# Patient Record
Sex: Female | Born: 1975 | Race: White | Hispanic: No | Marital: Married | Smoking: Never smoker
Health system: Southern US, Community
[De-identification: ages and names within clinical notes are randomized; demographics above are authoritative.]

## PROBLEM LIST (undated history)

## (undated) DIAGNOSIS — Z8619 Personal history of other infectious and parasitic diseases: Secondary | ICD-10-CM

## (undated) DIAGNOSIS — Z9889 Other specified postprocedural states: Secondary | ICD-10-CM

## (undated) HISTORY — PX: WISDOM TOOTH EXTRACTION: SHX21

## (undated) HISTORY — DX: Other specified postprocedural states: Z98.890

## (undated) HISTORY — PX: MYRINGOTOMY: SUR874

## (undated) HISTORY — PX: OTHER SURGICAL HISTORY: SHX169

## (undated) HISTORY — DX: Personal history of other infectious and parasitic diseases: Z86.19

---

## 1999-10-12 ENCOUNTER — Encounter: Payer: Self-pay | Admitting: Specialist

## 1999-10-12 ENCOUNTER — Encounter: Admission: RE | Admit: 1999-10-12 | Discharge: 1999-10-12 | Payer: Self-pay | Admitting: Specialist

## 2000-07-22 ENCOUNTER — Other Ambulatory Visit: Admission: RE | Admit: 2000-07-22 | Discharge: 2000-07-22 | Payer: Self-pay | Admitting: Gynecology

## 2001-07-24 ENCOUNTER — Other Ambulatory Visit: Admission: RE | Admit: 2001-07-24 | Discharge: 2001-07-24 | Payer: Self-pay | Admitting: Gynecology

## 2002-06-09 ENCOUNTER — Other Ambulatory Visit: Admission: RE | Admit: 2002-06-09 | Discharge: 2002-06-09 | Payer: Self-pay | Admitting: Gynecology

## 2003-06-14 ENCOUNTER — Other Ambulatory Visit: Admission: RE | Admit: 2003-06-14 | Discharge: 2003-06-14 | Payer: Self-pay | Admitting: Gynecology

## 2004-06-12 ENCOUNTER — Other Ambulatory Visit: Admission: RE | Admit: 2004-06-12 | Discharge: 2004-06-12 | Payer: Self-pay | Admitting: Gynecology

## 2005-07-12 ENCOUNTER — Other Ambulatory Visit: Admission: RE | Admit: 2005-07-12 | Discharge: 2005-07-12 | Payer: Self-pay | Admitting: Gynecology

## 2007-03-06 ENCOUNTER — Inpatient Hospital Stay (HOSPITAL_COMMUNITY): Admission: AD | Admit: 2007-03-06 | Discharge: 2007-03-09 | Payer: Self-pay | Admitting: Obstetrics and Gynecology

## 2009-10-10 ENCOUNTER — Inpatient Hospital Stay (HOSPITAL_COMMUNITY): Admission: AD | Admit: 2009-10-10 | Discharge: 2009-10-10 | Payer: Self-pay | Admitting: Obstetrics and Gynecology

## 2009-10-17 ENCOUNTER — Inpatient Hospital Stay (HOSPITAL_COMMUNITY)
Admission: RE | Admit: 2009-10-17 | Discharge: 2009-10-19 | Payer: Self-pay | Source: Home / Self Care | Admitting: Obstetrics and Gynecology

## 2010-06-10 LAB — CBC
HCT: 35.1 % — ABNORMAL LOW (ref 36.0–46.0)
Hemoglobin: 12.3 g/dL (ref 12.0–15.0)
MCH: 34.3 pg — ABNORMAL HIGH (ref 26.0–34.0)
MCHC: 35 g/dL (ref 30.0–36.0)
MCV: 97.9 fL (ref 78.0–100.0)
Platelets: 177 10*3/uL (ref 150–400)
RBC: 3.59 MIL/uL — ABNORMAL LOW (ref 3.87–5.11)
RDW: 13.1 % (ref 11.5–15.5)
WBC: 6 10*3/uL (ref 4.0–10.5)

## 2010-06-10 LAB — RPR: RPR Ser Ql: NONREACTIVE

## 2010-08-08 NOTE — Discharge Summary (Signed)
Bailey Figueroa, Bailey Figueroa               ACCOUNT NO.:  [id number]   MEDICAL RECORD NO.:  [id number]          PATIENT TYPE:  INP   LOCATION:  9137                          FACILITY:  [facility]   PHYSICIAN:  Malachi Pro. Ambrose Mantle, M.D. DATE OF BIRTH:  04/23/1975   DATE OF ADMISSION:  03/06/2007  DATE OF DISCHARGE:  03/09/2007                               DISCHARGE SUMMARY   HISTORY:  A 35 year old white female para 0, gravida 1, [redacted] weeks  gestation by 9 week ultrasound presented for elective induction with a  favorable cervix.  The blood group and type was B positive with negative  antibody, RPR nonreactive, rubella immune, hepatitis B surface antigen  negative, HIV negative, GC and chlamydia negative, 1 hour Glucola 109.  Group B strep negative.  Prenatal course was essentially uncomplicated.   PAST MEDICAL HISTORY:  Was negative.   PAST SURGICAL HISTORY:  Wisdom teeth removed and right myringotomy.   ALLERGY:  CODEINE caused emesis.   MEDICATIONS:  No medications.   PHYSICAL EXAMINATION:  On admission her vital signs were normal.  The  heart and lungs were normal.  The abdomen was soft, not tender.  Estimated fetal weight about 7 pounds.  The cervix was 3 cm, 50%, vertex  at a -2, adequate pelvis and Dr. Jackelyn Knife ruptured her membranes with  clear fluid.   ADMISSION IMPRESSION:  Intrauterine pregnancy of 40 weeks.  Pitocin was  begun.  At 5:35 a.m. the patient was comfortable with an epidural.  Fetal heart tones were reassuring.  Vaginal exam showed the vertex to be  at a +3 station, cervix completely dilated, completely effaced.  At 6:25  a.m. Dr. Jackelyn Knife discussed assisted delivery and risks.  The patient  pushed for just less than 3 hours.  She was unable to push the vertex  past a +3.  She agreed to assisted delivery.  The epidural was adequate.  The bladder was drained.  Local block was added to the perineum.  Soft  touch vacuum was applied and with three contractions the vertex was  brought to +4.  The vacuum popped off.  The vacuum was replaced after  the patient was unable to push the vertex out.  There were 2 more  popoffs.  A second degree midline episiotomy was cut to facilitate  prolonged second stage.  The patient was then able to push the vertex  out.  It was an 8 pound 13 ounce female infant with Apgars of 9 at one  and 9 at five minutes.  The placenta was spontaneous and intact.  Cord  blood collection was done.  Third degree extension repaired with 2-0  Vicryl.  Second degree episiotomy repaired with 3-0 Vicryl.  Rectum was  intact.  Blood loss about 500 mL.  Postpartum the patient did very well  and was discharged on the second postpartum day.  Initial hemoglobin  12.3, hematocrit 34.3, white count 6700, platelet count 206,000.  Followup hemoglobin 9.7, RPR was nonreactive.   FINAL DIAGNOSES:  Intrauterine pregnancy at 40 weeks delivered vertex,  prolonged second stage of labor.   OPERATION:  Vacuum assisted vaginal delivery, midline episiotomy with  third degree extension.   FINAL CONDITION:  Improved.   INSTRUCTIONS:  Include our regular discharge instruction booklet.  The  patient is also given prescription for Percocet 5/325 30 tablets one  every 4-6 hours as needed for pain and Motrin 600 mg 30 tablets one  every 6 hours as needed for pain with one refill and she is advised to  take Senokot, Metamucil, Citracal or Colace as a stool softener.  She is  to return to the office in 6 weeks for followup examination.      Malachi Pro. Ambrose Mantle, M.D.  Electronically Signed     TFH/MEDQ  D:  03/09/2007  T:  03/10/2007  Job:  [id number]

## 2011-01-01 LAB — CBC
HCT: 27.4 — ABNORMAL LOW
HCT: 34.3 — ABNORMAL LOW
Hemoglobin: 12.3
Hemoglobin: 9.7 — ABNORMAL LOW
MCHC: 35.3
MCHC: 35.8
MCV: 96.7
MCV: 97.9
Platelets: 149 — ABNORMAL LOW
Platelets: 206
RBC: 2.8 — ABNORMAL LOW
RBC: 3.55 — ABNORMAL LOW
RDW: 12.3
RDW: 12.8
WBC: 6.7
WBC: 7.6

## 2011-01-01 LAB — RPR: RPR Ser Ql: NONREACTIVE

## 2011-01-05 LAB — OB RESULTS CONSOLE ABO/RH: RH Type: POSITIVE

## 2011-01-05 LAB — OB RESULTS CONSOLE HIV ANTIBODY (ROUTINE TESTING): HIV: NONREACTIVE

## 2011-01-05 LAB — OB RESULTS CONSOLE HEPATITIS B SURFACE ANTIGEN: Hepatitis B Surface Ag: NEGATIVE

## 2011-01-05 LAB — OB RESULTS CONSOLE RPR: RPR: NONREACTIVE

## 2011-03-27 NOTE — L&D Delivery Note (Signed)
Delivery Note At 4:30 PM a viable female was delivered via Vaginal, Spontaneous Delivery (Presentation: Left Occiput Anterior).  APGAR: 9, 9; weight pending.   Placenta status: Intact, Spontaneous.  Cord: 3 vessels with the following complications: None.  Anesthesia: Epidural  Episiotomy:  None Lacerations: 2nd degree Suture Repair: 3.0 chromic Est. Blood Loss (mL): 400  Mom to postpartum.  Baby to stay with mom.  Atanacio Melnyk D 07/27/2011, 4:51 PM

## 2011-06-25 ENCOUNTER — Inpatient Hospital Stay (HOSPITAL_COMMUNITY): Admission: AD | Admit: 2011-06-25 | Payer: Self-pay | Source: Ambulatory Visit | Admitting: Obstetrics and Gynecology

## 2011-07-02 ENCOUNTER — Ambulatory Visit: Payer: 59 | Admitting: Internal Medicine

## 2011-07-02 LAB — OB RESULTS CONSOLE GBS: GBS: POSITIVE

## 2011-07-24 ENCOUNTER — Telehealth (HOSPITAL_COMMUNITY): Payer: Self-pay | Admitting: *Deleted

## 2011-07-24 ENCOUNTER — Encounter (HOSPITAL_COMMUNITY): Payer: Self-pay | Admitting: *Deleted

## 2011-07-24 NOTE — Telephone Encounter (Signed)
Preadmission screen  

## 2011-07-27 ENCOUNTER — Encounter (HOSPITAL_COMMUNITY): Payer: Self-pay | Admitting: Anesthesiology

## 2011-07-27 ENCOUNTER — Encounter (HOSPITAL_COMMUNITY): Payer: Self-pay

## 2011-07-27 ENCOUNTER — Inpatient Hospital Stay (HOSPITAL_COMMUNITY)
Admission: RE | Admit: 2011-07-27 | Discharge: 2011-07-29 | DRG: 775 | Disposition: A | Discharge status: Home / Self Care | Payer: 59 | Source: Ambulatory Visit | Attending: Obstetrics and Gynecology | Admitting: Obstetrics and Gynecology

## 2011-07-27 ENCOUNTER — Inpatient Hospital Stay (HOSPITAL_COMMUNITY): Payer: 59 | Admitting: Anesthesiology

## 2011-07-27 VITALS — BP 86/55 | HR 56 | Temp 97.9°F | Resp 18 | Ht 66.0 in | Wt 147.0 lb

## 2011-07-27 DIAGNOSIS — O09529 Supervision of elderly multigravida, unspecified trimester: Secondary | ICD-10-CM | POA: Diagnosis present

## 2011-07-27 DIAGNOSIS — Z22330 Carrier of Group B streptococcus: Secondary | ICD-10-CM

## 2011-07-27 DIAGNOSIS — Z3490 Encounter for supervision of normal pregnancy, unspecified, unspecified trimester: Secondary | ICD-10-CM | POA: Diagnosis present

## 2011-07-27 DIAGNOSIS — O99892 Other specified diseases and conditions complicating childbirth: Secondary | ICD-10-CM | POA: Diagnosis present

## 2011-07-27 LAB — CBC
Hemoglobin: 11.4 g/dL — ABNORMAL LOW (ref 12.0–15.0)
MCH: 32.1 pg (ref 26.0–34.0)
MCHC: 34.5 g/dL (ref 30.0–36.0)
Platelets: 193 10*3/uL (ref 150–400)

## 2011-07-27 LAB — RPR: RPR Ser Ql: NONREACTIVE

## 2011-07-27 MED ORDER — OXYTOCIN 20 UNITS IN LACTATED RINGERS INFUSION - SIMPLE: 125.0000 mL/h | Freq: Once | INTRAVENOUS | Status: DC

## 2011-07-27 MED ORDER — FENTANYL 2.5 MCG/ML BUPIVACAINE 1/10 % EPIDURAL INFUSION (WH - ANES)
INTRAMUSCULAR | Status: DC | PRN
  Administered 2011-07-27: 14 mL/h via EPIDURAL

## 2011-07-27 MED ORDER — ONDANSETRON HCL 4 MG/2ML IJ SOLN: 4.0000 mg | Freq: Four times a day (QID) | INTRAMUSCULAR | Status: DC | PRN

## 2011-07-27 MED ORDER — OXYTOCIN 20 UNITS IN LACTATED RINGERS INFUSION - SIMPLE: 125.0000 mL/h | INTRAVENOUS | Status: DC | PRN

## 2011-07-27 MED ORDER — OXYCODONE-ACETAMINOPHEN 5-325 MG PO TABS
1.0000 | ORAL_TABLET | ORAL | Status: DC | PRN
  Administered 2011-07-27: 1 via ORAL
  Filled 2011-07-27: qty 1

## 2011-07-27 MED ORDER — SODIUM BICARBONATE 8.4 % IV SOLN
INTRAVENOUS | Status: DC | PRN
  Administered 2011-07-27: 4 mL via EPIDURAL

## 2011-07-27 MED ORDER — CITRIC ACID-SODIUM CITRATE 334-500 MG/5ML PO SOLN: 30.0000 mL | ORAL | Status: DC | PRN

## 2011-07-27 MED ORDER — LACTATED RINGERS IV SOLN
INTRAVENOUS | Status: DC
  Administered 2011-07-27: 950 mL via INTRAVENOUS

## 2011-07-27 MED ORDER — DIPHENHYDRAMINE HCL 50 MG/ML IJ SOLN: 12.5000 mg | INTRAMUSCULAR | Status: DC | PRN

## 2011-07-27 MED ORDER — MAGNESIUM HYDROXIDE 400 MG/5ML PO SUSP: 30.0000 mL | ORAL | Status: DC | PRN

## 2011-07-27 MED ORDER — EPHEDRINE 5 MG/ML INJ: 10.0000 mg | INTRAVENOUS | Status: DC | PRN

## 2011-07-27 MED ORDER — BENZOCAINE-MENTHOL 20-0.5 % EX AERO
1.0000 "application " | INHALATION_SPRAY | CUTANEOUS | Status: DC | PRN
  Administered 2011-07-28 – 2011-07-29 (×2): 1 via TOPICAL
  Filled 2011-07-27 (×3): qty 56

## 2011-07-27 MED ORDER — ACETAMINOPHEN 325 MG PO TABS: 650.0000 mg | ORAL_TABLET | ORAL | Status: DC | PRN

## 2011-07-27 MED ORDER — PHENYLEPHRINE 40 MCG/ML (10ML) SYRINGE FOR IV PUSH (FOR BLOOD PRESSURE SUPPORT)
80.0000 ug | PREFILLED_SYRINGE | INTRAVENOUS | Status: DC | PRN
  Filled 2011-07-27: qty 5

## 2011-07-27 MED ORDER — EPHEDRINE 5 MG/ML INJ
10.0000 mg | INTRAVENOUS | Status: DC | PRN
  Filled 2011-07-27: qty 4

## 2011-07-27 MED ORDER — PHENYLEPHRINE 40 MCG/ML (10ML) SYRINGE FOR IV PUSH (FOR BLOOD PRESSURE SUPPORT): 80.0000 ug | PREFILLED_SYRINGE | INTRAVENOUS | Status: DC | PRN

## 2011-07-27 MED ORDER — DIPHENHYDRAMINE HCL 25 MG PO CAPS: 25.0000 mg | ORAL_CAPSULE | Freq: Four times a day (QID) | ORAL | Status: DC | PRN

## 2011-07-27 MED ORDER — ZOLPIDEM TARTRATE 5 MG PO TABS: 5.0000 mg | ORAL_TABLET | Freq: Every evening | ORAL | Status: DC | PRN

## 2011-07-27 MED ORDER — OXYCODONE-ACETAMINOPHEN 5-325 MG PO TABS: 1.0000 | ORAL_TABLET | ORAL | Status: DC | PRN

## 2011-07-27 MED ORDER — TETANUS-DIPHTH-ACELL PERTUSSIS 5-2.5-18.5 LF-MCG/0.5 IM SUSP
0.5000 mL | Freq: Once | INTRAMUSCULAR | Status: AC
  Administered 2011-07-29: 0.5 mL via INTRAMUSCULAR
  Filled 2011-07-27: qty 0.5

## 2011-07-27 MED ORDER — ONDANSETRON HCL 4 MG PO TABS: 4.0000 mg | ORAL_TABLET | ORAL | Status: DC | PRN

## 2011-07-27 MED ORDER — ONDANSETRON HCL 4 MG/2ML IJ SOLN: 4.0000 mg | INTRAMUSCULAR | Status: DC | PRN

## 2011-07-27 MED ORDER — LACTATED RINGERS IV SOLN: 500.0000 mL | Freq: Once | INTRAVENOUS | Status: DC

## 2011-07-27 MED ORDER — PENICILLIN G POTASSIUM 5000000 UNITS IJ SOLR
2.5000 10*6.[IU] | INTRAVENOUS | Status: DC
  Administered 2011-07-27: 2.5 10*6.[IU] via INTRAVENOUS
  Filled 2011-07-27 (×5): qty 2.5

## 2011-07-27 MED ORDER — OXYTOCIN BOLUS FROM INFUSION
500.0000 mL | Freq: Once | INTRAVENOUS | Status: DC
  Filled 2011-07-27: qty 500

## 2011-07-27 MED ORDER — PENICILLIN G POTASSIUM 5000000 UNITS IJ SOLR
5.0000 10*6.[IU] | Freq: Once | INTRAVENOUS | Status: AC
  Administered 2011-07-27: 5 10*6.[IU] via INTRAVENOUS
  Filled 2011-07-27: qty 5

## 2011-07-27 MED ORDER — FENTANYL 2.5 MCG/ML BUPIVACAINE 1/10 % EPIDURAL INFUSION (WH - ANES)
14.0000 mL/h | INTRAMUSCULAR | Status: DC
  Administered 2011-07-27: 14 mL/h via EPIDURAL
  Filled 2011-07-27 (×2): qty 60

## 2011-07-27 MED ORDER — LANOLIN HYDROUS EX OINT: TOPICAL_OINTMENT | CUTANEOUS | Status: DC | PRN

## 2011-07-27 MED ORDER — MEASLES, MUMPS & RUBELLA VAC ~~LOC~~ INJ: 0.5000 mL | INJECTION | Freq: Once | SUBCUTANEOUS | Status: DC

## 2011-07-27 MED ORDER — IBUPROFEN 600 MG PO TABS: 600.0000 mg | ORAL_TABLET | Freq: Four times a day (QID) | ORAL | Status: DC | PRN

## 2011-07-27 MED ORDER — DIBUCAINE 1 % RE OINT: 1.0000 "application " | TOPICAL_OINTMENT | RECTAL | Status: DC | PRN

## 2011-07-27 MED ORDER — WITCH HAZEL-GLYCERIN EX PADS: 1.0000 "application " | MEDICATED_PAD | CUTANEOUS | Status: DC | PRN

## 2011-07-27 MED ORDER — OXYTOCIN 20 UNITS IN LACTATED RINGERS INFUSION - SIMPLE
1.0000 m[IU]/min | INTRAVENOUS | Status: DC
  Administered 2011-07-27: 10 m[IU]/min via INTRAVENOUS
  Administered 2011-07-27: 2 m[IU]/min via INTRAVENOUS
  Administered 2011-07-27: 12 m[IU]/min via INTRAVENOUS
  Administered 2011-07-27: 8 m[IU]/min via INTRAVENOUS
  Filled 2011-07-27: qty 1000

## 2011-07-27 MED ORDER — METHYLERGONOVINE MALEATE 0.2 MG/ML IJ SOLN: 0.2000 mg | INTRAMUSCULAR | Status: DC | PRN

## 2011-07-27 MED ORDER — TERBUTALINE SULFATE 1 MG/ML IJ SOLN: 0.2500 mg | Freq: Once | INTRAMUSCULAR | Status: DC | PRN

## 2011-07-27 MED ORDER — LACTATED RINGERS IV SOLN: 500.0000 mL | INTRAVENOUS | Status: DC | PRN

## 2011-07-27 MED ORDER — METHYLERGONOVINE MALEATE 0.2 MG PO TABS: 0.2000 mg | ORAL_TABLET | ORAL | Status: DC | PRN

## 2011-07-27 MED ORDER — SENNOSIDES-DOCUSATE SODIUM 8.6-50 MG PO TABS
2.0000 | ORAL_TABLET | Freq: Every day | ORAL | Status: DC
  Administered 2011-07-27: 2 via ORAL

## 2011-07-27 MED ORDER — SIMETHICONE 80 MG PO CHEW: 80.0000 mg | CHEWABLE_TABLET | ORAL | Status: DC | PRN

## 2011-07-27 MED ORDER — PRENATAL MULTIVITAMIN CH
1.0000 | ORAL_TABLET | Freq: Every day | ORAL | Status: DC
  Administered 2011-07-28 – 2011-07-29 (×2): 1 via ORAL
  Filled 2011-07-27 (×2): qty 1

## 2011-07-27 MED ORDER — IBUPROFEN 600 MG PO TABS
600.0000 mg | ORAL_TABLET | Freq: Four times a day (QID) | ORAL | Status: DC
  Administered 2011-07-28 – 2011-07-29 (×6): 600 mg via ORAL
  Filled 2011-07-27 (×6): qty 1

## 2011-07-27 MED ORDER — LIDOCAINE HCL (PF) 1 % IJ SOLN
30.0000 mL | INTRAMUSCULAR | Status: DC | PRN
  Filled 2011-07-27: qty 30

## 2011-07-27 NOTE — H&P (Signed)
Bailey Figueroa is a 36 y.o. female presenting for elective induction.  Prenatal care complicated by palpitations with normal cardiology eval.  See prenatal records for complete history.   Maternal Medical History:  Fetal activity: Perceived fetal activity is normal.    Prenatal complications: no prenatal complications   OB History    Grav Para Term Preterm Abortions TAB SAB Ect Mult Living   3 2 2       2     SVD at term x2 without complications  Past Medical History  Diagnosis Date  . History of [facility] spotted fever   . H/O myringotomy    Past Surgical History  Procedure Date  . Myringotomy   . Wisdom tooth extraction    Family History: family history includes Arthritis in her paternal grandmother; Heart attack in her father, maternal grandfather, maternal grandmother, paternal grandfather, and paternal grandmother; Stroke in her mother and paternal grandmother; and Vision loss in her paternal grandmother. Social History:  reports that she has never smoked. She has never used smokeless tobacco. She reports that she does not drink alcohol or use illicit drugs.  Review of Systems  Respiratory: Negative.   Cardiovascular: Negative.     Dilation: 1 Effacement (%): 30 Station: -2 Exam by:: D herr rn Blood pressure 110/75, pulse 59, temperature 98.5 F (36.9 C), temperature source Oral, resp. rate 18, height 5\' 6"  (1.676 m), weight 66.679 kg (147 lb), last menstrual period 10/25/2010. Maternal Exam:  Uterine Assessment: Contraction strength is moderate.  Contraction frequency is irregular.   Abdomen: Patient reports no abdominal tenderness. Estimated fetal weight is 7 lbs.   Fetal presentation: vertex  Introitus: Normal vulva. Normal vagina.  Pelvis: adequate for delivery.   Cervix: Cervix evaluated by digital exam.     Fetal Exam Fetal Monitor Review: Mode: ultrasound.   Baseline rate: 130.  Variability: moderate (6-25 bpm).   Pattern: accelerations present.     Fetal State Assessment: Category I - tracings are normal.     Physical Exam  Constitutional: She appears well-developed and well-nourished.  Cardiovascular: Normal rate, regular rhythm and normal heart sounds.   No murmur heard. Respiratory: Effort normal and breath sounds normal. No respiratory distress. She has no wheezes.  GI: Soft.       Gravid     Prenatal labs: ABO, Rh: B/Positive/-- (10/12 0000) Antibody: Negative (10/12 0000) Rubella: Immune (10/12 0000) RPR: Nonreactive (10/12 0000)  HBsAg: Negative (10/12 0000)  HIV: Non-reactive (10/12 0000)  GBS: Positive (04/08 0000)   Assessment/Plan: IUP at 39+ weeks for elective induction, on pitocin.  Monitor progress, on PCN for +GBS.     Rayyan Burley D 07/27/2011, 9:35 AM

## 2011-07-27 NOTE — Anesthesia Procedure Notes (Signed)
Epidural Patient location during procedure: OB  Staffing Anesthesiologist: Jiles Garter  Preanesthetic Checklist Completed: patient identified, site marked, surgical consent, pre-op evaluation, timeout performed, IV checked, risks and benefits discussed and monitors and equipment checked  Epidural Patient position: sitting Prep: site prepped and draped and DuraPrep Patient monitoring: continuous pulse ox and blood pressure Approach: midline Injection technique: LOR air  Needle:  Needle type: Tuohy  Needle gauge: 17 G Needle length: 9 cm Needle insertion depth: 5 cm cm Catheter type: closed end flexible Catheter size: 19 Gauge Catheter at skin depth: 10 cm Test dose: negative  Assessment Events: blood not aspirated, injection not painful, no injection resistance, negative IV test and no paresthesia  Additional Notes Dosing of Epidural:  1st dose, through needle ............................................Marland Kitchen epi 1:200K + Xylocaine 40 mg  2nd dose, through catheter, after waiting 3 minutes...Marland KitchenMarland Kitchenepi 1:200K + Xylocaine 40 mg  3rd dose, through catheter after waiting 3 minutes .............................Marcaine   4mg    ( mg Marcaine are expressed as equivilent  cc's medication removed from the 0.1%Bupiv / fentanyl syringe from L&D pump)  ( 2% Xylo charted as a single dose in Epic Meds for ease of charting; actual dosing was fractionated as above, for saftey's sake)  As each dose occurred, patient was free of IV sx; and patient exhibited no evidence of SA injection.  Patient is more comfortable after epidural dosed. Please see RN's note for documentation of vital signs,and FHR which are stable.

## 2011-07-27 NOTE — Anesthesia Preprocedure Evaluation (Signed)

## 2011-07-28 ENCOUNTER — Encounter (HOSPITAL_COMMUNITY): Payer: Self-pay

## 2011-07-28 NOTE — Progress Notes (Signed)
PPD #1 No problems Afeb, VSS Fundus firm, NT at U-1 Continue routine postpartum care 

## 2011-07-29 NOTE — Discharge Summary (Signed)
Obstetric Discharge Summary Reason for Admission: induction of labor Prenatal Procedures: none Intrapartum Procedures: spontaneous vaginal delivery Postpartum Procedures: none Complications-Operative and Postpartum: 2nd degree perineal laceration Hemoglobin  Date Value Range Status  07/27/2011 11.4* 12.0-15.0 (g/dL) Final     HCT  Date Value Range Status  07/27/2011 33.0* 36.0-46.0 (%) Final    Physical Exam:  General: alert Lochia: appropriate Uterine Fundus: firm  Discharge Diagnoses: Term Pregnancy-delivered  Discharge Information: Date: 07/29/2011 Activity: pelvic rest Diet: routine Medications: Ibuprofen Condition: stable Instructions: refer to practice specific booklet Discharge to: home Follow-up Information    Follow up with Mahdiya Mossberg D, MD. Schedule an appointment as soon as possible for a visit in 6 weeks.   Contact information:   [address], [address] [place] [address] [phone]          Newborn Data: Live born female  Birth Weight: 7 lb 13 oz (3544 g) APGAR: 9, 9  Home with mother.  Charnetta Wulff D 07/29/2011, 10:27 AM

## 2011-07-29 NOTE — Progress Notes (Signed)
PPD #2 No problems Afeb, VSS Fundus firm, NT at U-1 D/c home 

## 2011-07-30 ENCOUNTER — Encounter (HOSPITAL_COMMUNITY): Payer: Self-pay

## 2014-01-25 ENCOUNTER — Encounter (HOSPITAL_COMMUNITY): Payer: Self-pay

## 2017-04-04 ENCOUNTER — Other Ambulatory Visit: Payer: Self-pay | Admitting: Obstetrics and Gynecology

## 2017-04-04 DIAGNOSIS — R928 Other abnormal and inconclusive findings on diagnostic imaging of breast: Secondary | ICD-10-CM

## 2017-04-10 ENCOUNTER — Other Ambulatory Visit: Payer: Self-pay

## 2017-04-11 ENCOUNTER — Other Ambulatory Visit: Payer: Self-pay

## 2017-04-15 ENCOUNTER — Ambulatory Visit: Payer: Self-pay

## 2017-04-15 ENCOUNTER — Ambulatory Visit
Admission: RE | Admit: 2017-04-15 | Discharge: 2017-04-15 | Disposition: A | Discharge status: Home / Self Care | Payer: 59 | Source: Ambulatory Visit | Attending: Obstetrics and Gynecology | Admitting: Obstetrics and Gynecology

## 2017-04-15 DIAGNOSIS — R928 Other abnormal and inconclusive findings on diagnostic imaging of breast: Secondary | ICD-10-CM

## 2019-09-17 ENCOUNTER — Other Ambulatory Visit: Payer: Self-pay | Admitting: Obstetrics and Gynecology

## 2019-09-17 DIAGNOSIS — R921 Mammographic calcification found on diagnostic imaging of breast: Secondary | ICD-10-CM

## 2019-10-05 ENCOUNTER — Other Ambulatory Visit: Payer: Self-pay | Admitting: Obstetrics and Gynecology

## 2019-10-05 DIAGNOSIS — R921 Mammographic calcification found on diagnostic imaging of breast: Secondary | ICD-10-CM

## 2019-10-06 ENCOUNTER — Other Ambulatory Visit: Payer: Self-pay

## 2019-10-06 ENCOUNTER — Ambulatory Visit
Admission: RE | Admit: 2019-10-06 | Discharge: 2019-10-06 | Disposition: A | Discharge status: Home / Self Care | Payer: 59 | Source: Ambulatory Visit | Attending: Obstetrics and Gynecology | Admitting: Obstetrics and Gynecology

## 2019-10-06 DIAGNOSIS — R921 Mammographic calcification found on diagnostic imaging of breast: Secondary | ICD-10-CM

## 2021-08-15 ENCOUNTER — Encounter: Payer: Self-pay | Admitting: Internal Medicine

## 2021-09-12 ENCOUNTER — Telehealth: Payer: Self-pay | Admitting: *Deleted

## 2021-09-12 NOTE — Telephone Encounter (Signed)
Patient called to f/u on referral that PCP reports sending last week. Informed her that it was just sent in today. Referral for low WBC. Informed her her records will be reviewed and she will be called w/appointment. She reports being very anxious about this--reassured her that her WBC is not critical and that [place] is normal, so this is not medically urgent. She requests appointment as soon as possible (recent family member has died of cancer).

## 2021-09-13 ENCOUNTER — Other Ambulatory Visit: Payer: Self-pay | Admitting: Lab

## 2021-09-13 ENCOUNTER — Other Ambulatory Visit: Payer: Self-pay

## 2021-09-13 ENCOUNTER — Inpatient Hospital Stay: Payer: 59 | Attending: Hematology & Oncology

## 2021-09-13 ENCOUNTER — Inpatient Hospital Stay (HOSPITAL_BASED_OUTPATIENT_CLINIC_OR_DEPARTMENT_OTHER): Payer: 59 | Admitting: Hematology & Oncology

## 2021-09-13 ENCOUNTER — Encounter: Payer: Self-pay | Admitting: Hematology & Oncology

## 2021-09-13 ENCOUNTER — Telehealth: Payer: Self-pay | Admitting: *Deleted

## 2021-09-13 ENCOUNTER — Inpatient Hospital Stay: Payer: 59

## 2021-09-13 VITALS — BP 115/65 | HR 70 | Temp 98.2°F | Resp 18 | Ht 64.5 in | Wt 114.1 lb

## 2021-09-13 DIAGNOSIS — D72819 Decreased white blood cell count, unspecified: Secondary | ICD-10-CM

## 2021-09-13 LAB — CBC WITH DIFFERENTIAL (CANCER CENTER ONLY)
Abs Immature Granulocytes: 0.01 10*3/uL (ref 0.00–0.07)
Basophils Absolute: 0 10*3/uL (ref 0.0–0.1)
Basophils Relative: 1 %
Eosinophils Absolute: 0 10*3/uL (ref 0.0–0.5)
Eosinophils Relative: 1 %
HCT: 39.1 % (ref 36.0–46.0)
Hemoglobin: 13.3 g/dL (ref 12.0–15.0)
Immature Granulocytes: 0 %
Lymphocytes Relative: 18 %
Lymphs Abs: 0.9 10*3/uL (ref 0.7–4.0)
MCH: 30 pg (ref 26.0–34.0)
MCHC: 34 g/dL (ref 30.0–36.0)
MCV: 88.3 fL (ref 80.0–100.0)
Monocytes Absolute: 0.5 10*3/uL (ref 0.1–1.0)
Monocytes Relative: 9 %
Neutro Abs: 3.7 10*3/uL (ref 1.7–7.7)
Neutrophils Relative %: 71 %
Platelet Count: 218 10*3/uL (ref 150–400)
RBC: 4.43 MIL/uL (ref 3.87–5.11)
RDW: 12.7 % (ref 11.5–15.5)
WBC Count: 5.2 10*3/uL (ref 4.0–10.5)
nRBC: 0 % (ref 0.0–0.2)

## 2021-09-13 LAB — URINALYSIS, COMPLETE (UACMP) WITH MICROSCOPIC
Bilirubin Urine: NEGATIVE
Glucose, UA: NEGATIVE mg/dL
Hgb urine dipstick: NEGATIVE
Ketones, ur: 15 mg/dL — AB
Leukocytes,Ua: NEGATIVE
Nitrite: NEGATIVE
Protein, ur: NEGATIVE mg/dL
Specific Gravity, Urine: 1.02 (ref 1.005–1.030)
pH: 5.5 (ref 5.0–8.0)

## 2021-09-13 LAB — SAVE SMEAR(SSMR), FOR PROVIDER SLIDE REVIEW

## 2021-09-13 NOTE — Telephone Encounter (Signed)
As noted below by Dr. Marin Olp, I informed the patient that there is a small amount of bacteria in the urine. We are waiting on urine culture to come back. Right now there is no infection or need for antibiotics. She verbalized understanding.

## 2021-09-13 NOTE — Telephone Encounter (Signed)
Per 09/13/21 los - gave upcoming appointments - confirmed

## 2021-09-13 NOTE — Telephone Encounter (Signed)
-----   Message from [name], MD sent at 09/13/2021 12:47 PM EDT ----- Call - there is a small amount of bacteria in the urine.  We will see if there is any obvious infection.  No antibiotics unless we find an infection.  Bailey Figueroa

## 2021-09-13 NOTE — Progress Notes (Signed)
Referral MD  Reason for Referral: Leukopenia-transient  Chief Complaint  Patient presents with   New Patient (Initial Visit)  : White blood cells are low.  HPI: Bailey Figueroa is a very nice 46 year old white female.  She is originally from [place].  She has 3 children.  She works for an [employer].  Her husband is a Engineer, structural.  I thanked her for what he did for our community.  She has been very healthy.  She is followed by Dr. Lindell Noe.  She noted that Bailey Figueroa had a drop in her white blood cell count.  Her white blood cell count was down to 3.4.  It was then followed up.  It was 3.7.  I do not see any count or differential on the white blood cells that were taken.  Bailey Figueroa has had no problems with small lymph nodes.  She has had no fever.  She has had no change in bowel or bladder habits.  She does complain of some crampiness in the lower abdomen.  We will check a urine on her.  She has not had a COVID-vaccine for about a year or so.  She has had no rashes.  She was having [place] for vacation.  She had no problems while she was up there.  She is not a vegetarian.  She does not smoke.  She has rare alcohol use.  As far she knows, there is no history in the family of any kind of blood problems.  She has had no surgery.  She has had no leg swelling.  She has had no rashes.  There is no joint problems.  She does exercise.  Overall, I would say that her performance status is ECOG 0.    Past Medical History:  Diagnosis Date   H/O myringotomy    History of [place] spotted fever   :   Past Surgical History:  Procedure Laterality Date   MYRINGOTOMY     WISDOM TOOTH EXTRACTION    :   Current Outpatient Medications:    Multiple Vitamin (MULTIVITAMIN) tablet, Take 1 tablet by mouth daily., Disp: , Rfl: :  :   Allergies  Allergen Reactions   Codeine Nausea And Vomiting  :   Family History  Problem Relation Age of Onset   Stroke Mother    Heart  attack Father    Heart attack Maternal Grandmother    Heart attack Maternal Grandfather    Heart attack Paternal Grandmother    Stroke Paternal Grandmother    Arthritis Paternal Grandmother    Vision loss Paternal Grandmother        glaucoma   Heart attack Paternal Grandfather   :   Social History   Socioeconomic History   Marital status: Married    Spouse name: Not on file   Number of children: Not on file   Years of education: Not on file   Highest education level: Not on file  Occupational History   Not on file  Tobacco Use   Smoking status: Never   Smokeless tobacco: Never  Vaping Use   Vaping Use: Never used  Substance and Sexual Activity   Alcohol use: No   Drug use: No   Sexual activity: Yes  Other Topics Concern   Not on file  Social History Narrative   Not on file   Social Determinants of Health   Financial Resource Strain: Not on file  Food Insecurity: Not on file  Transportation Needs: Not on  file  Physical Activity: Not on file  Stress: Not on file  Social Connections: Not on file  Intimate Partner Violence: Not on file  :  Review of Systems  Constitutional: Negative.   HENT: Negative.    Eyes: Negative.   Respiratory: Negative.    Cardiovascular: Negative.   Gastrointestinal:  Positive for abdominal pain and constipation.  Genitourinary: Negative.   Musculoskeletal: Negative.   Skin: Negative.   Neurological: Negative.   Endo/Heme/Allergies: Negative.   Psychiatric/Behavioral: Negative.       Exam: Vital signs show temperature of 98.2.  Pulse 70.  Blood pressure 115/65.  Weight is 114 pounds. @IPVITALS @ Physical Exam Vitals reviewed.  Constitutional:      Comments: In general, there is no palpable adenopathy.  HENT:     Head: Normocephalic and atraumatic.  Eyes:     Pupils: Pupils are equal, round, and reactive to light.  Cardiovascular:     Rate and Rhythm: Normal rate and regular rhythm.     Heart sounds: Normal heart sounds.   Pulmonary:     Effort: Pulmonary effort is normal.     Breath sounds: Normal breath sounds.  Abdominal:     General: Bowel sounds are normal.     Palpations: Abdomen is soft.     Comments: Abdominal exam is soft.  She has good bowel sounds.  There is no fluid wave.  There may be little bit of tenderness to palpation in the lower abdomen below the umbilicus.  There is no abdominal mass.  There is no palpable liver edge.  There is no splenomegaly.  I cannot palpate any inguinal adenopathy.  Musculoskeletal:        General: No tenderness or deformity. Normal range of motion.     Cervical back: Normal range of motion.  Lymphadenopathy:     Cervical: No cervical adenopathy.  Skin:    General: Skin is warm and dry.     Findings: No erythema or rash.  Neurological:     Mental Status: She is alert and oriented to person, place, and time.  Psychiatric:        Behavior: Behavior normal.        Thought Content: Thought content normal.        Judgment: Judgment normal.      Recent Labs    09/13/21 1047  WBC 5.2  HGB 13.3  HCT 39.1  PLT 218   No results for input(s): "NA", "K", "CL", "CO2", "GLUCOSE", "BUN", "CREATININE", "CALCIUM" in the last 72 hours.  Blood smear review: Normochromic and normocytic population of red blood cells.  There are no nucleated red blood cells.  I see no rouleaux formation.  There are no teardrop cells.  There are no inclusion bodies.  She has no schistocytes or spherocytes.  White blood cells appear normal in morphology and maturation.  I do not see any immature myeloid or lymphoid forms.  There are no hypersegmented polys.  Platelets are adequate in number and size.  Platelets are well granulated.  Pathology: None    Assessment and Plan: Bailey Figueroa is a very charming 46 year old white female.  She has transient leukopenia.  I really cannot explain why she had the low white cells before.  We are checking a urinalysis on her.  I am not sure why she is  having this cramping down the lower abdomen.  She is seeing gastroenterology.  I think they are going to do a colonoscopy on her in 3 weeks.  This is a routine colonoscopy as since she is 46 years old.  I just do not see anything on exam or on her blood smear that would suggest a hematologic issue.  I would not think that if she has any type of bone marrow disorder.  I do not see that she needs to have a bone marrow biopsy done.  I do not see that she needs any scans done.  I had a long talk with her.  I tried to reassure her.  Again, she has a normal white blood cell count.  She has a normal white blood cell differential.  These are all incredibly important from my point of view.  We will follow-up with her in 3 months.  We will see what her blood counts look like at that point.

## 2021-09-14 LAB — URINE CULTURE: Culture: NO GROWTH

## 2021-09-14 NOTE — Telephone Encounter (Signed)
Per Dr. Marin Olp, I called the patient and informed her that the Urine culture came back negative. There is no infection and no need for antibiotics. She verbalized understanding.

## 2021-09-15 ENCOUNTER — Ambulatory Visit (AMBULATORY_SURGERY_CENTER): Payer: Self-pay

## 2021-09-15 ENCOUNTER — Other Ambulatory Visit: Payer: Self-pay

## 2021-09-15 VITALS — Ht 64.5 in | Wt 116.6 lb

## 2021-09-15 DIAGNOSIS — Z1211 Encounter for screening for malignant neoplasm of colon: Secondary | ICD-10-CM

## 2021-09-15 MED ORDER — NA SULFATE-K SULFATE-MG SULF 17.5-3.13-1.6 GM/177ML PO SOLN: 1.0000 | Freq: Once | ORAL | 0 refills | Status: DC

## 2021-09-15 MED ORDER — NA SULFATE-K SULFATE-MG SULF 17.5-3.13-1.6 GM/177ML PO SOLN: 1.0000 | Freq: Once | ORAL | 0 refills | Status: AC

## 2021-09-20 IMAGING — MG DIGITAL DIAGNOSTIC BILAT W/ TOMO W/ CAD
6 of 10 series · 6 of 26 positions shown · non-contrast
Comparison: Previous exam(s).

CLINICAL DATA: Follow-up right breast probably benign
calcifications.

EXAM:
DIGITAL DIAGNOSTIC BILATERAL MAMMOGRAM WITH TOMO AND CAD

[R ML]
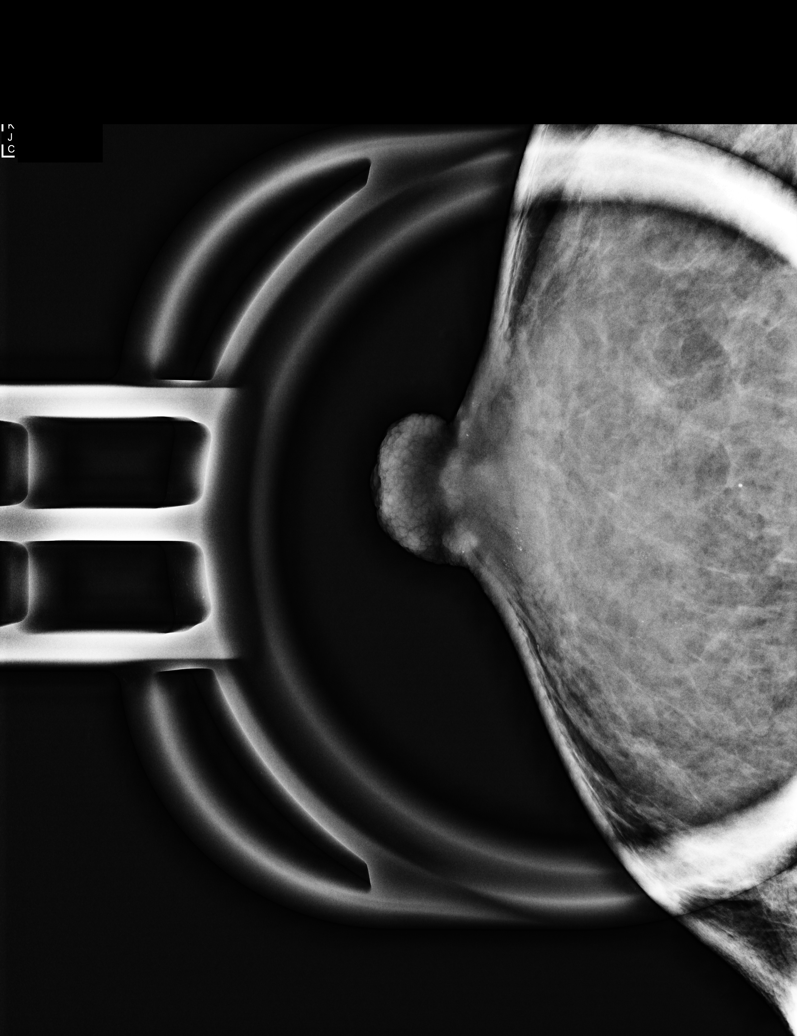

[R CC]
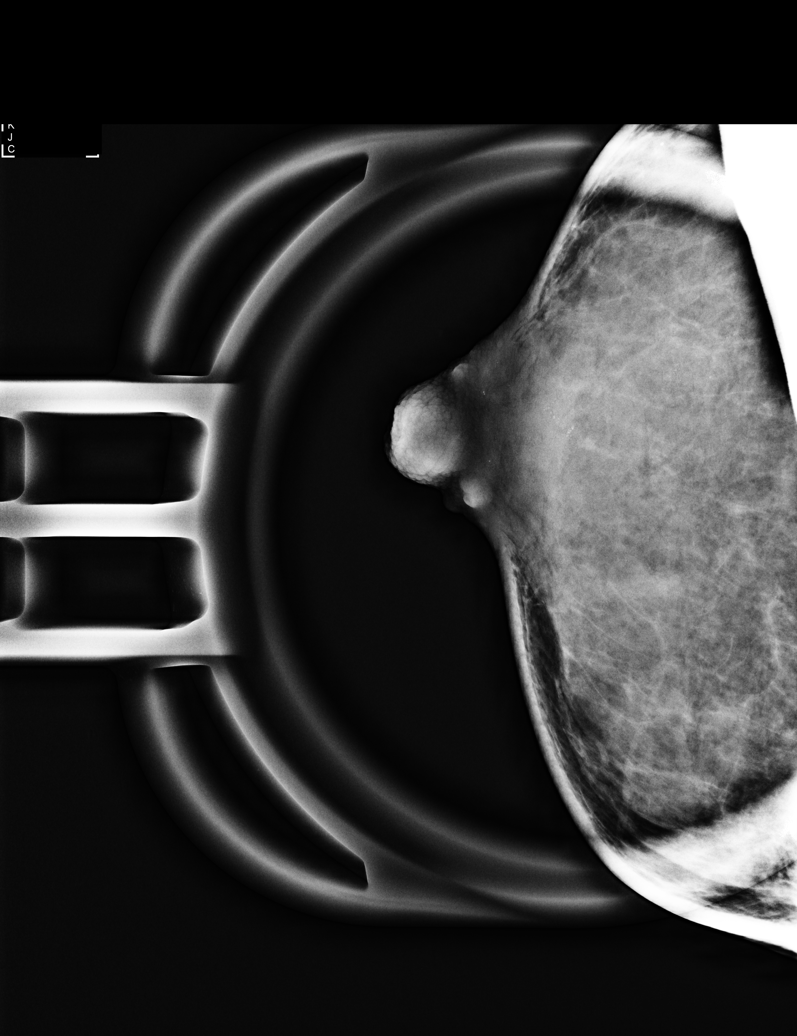

[R CC synth-2D]
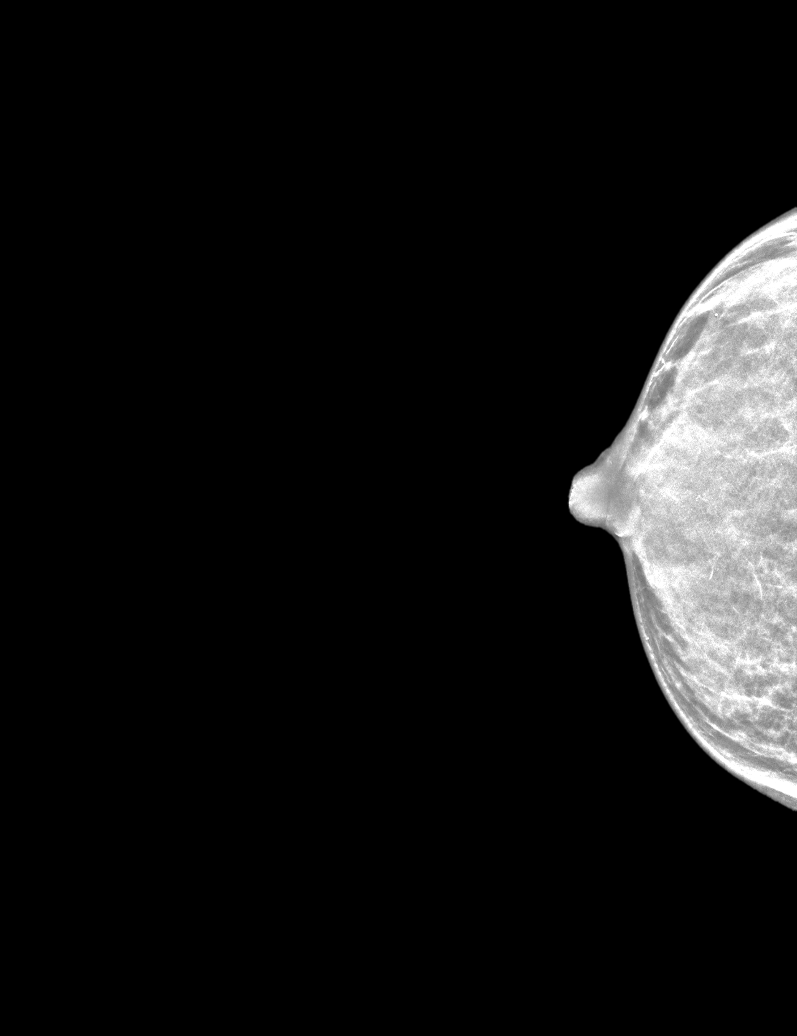

[R MLO synth-2D]
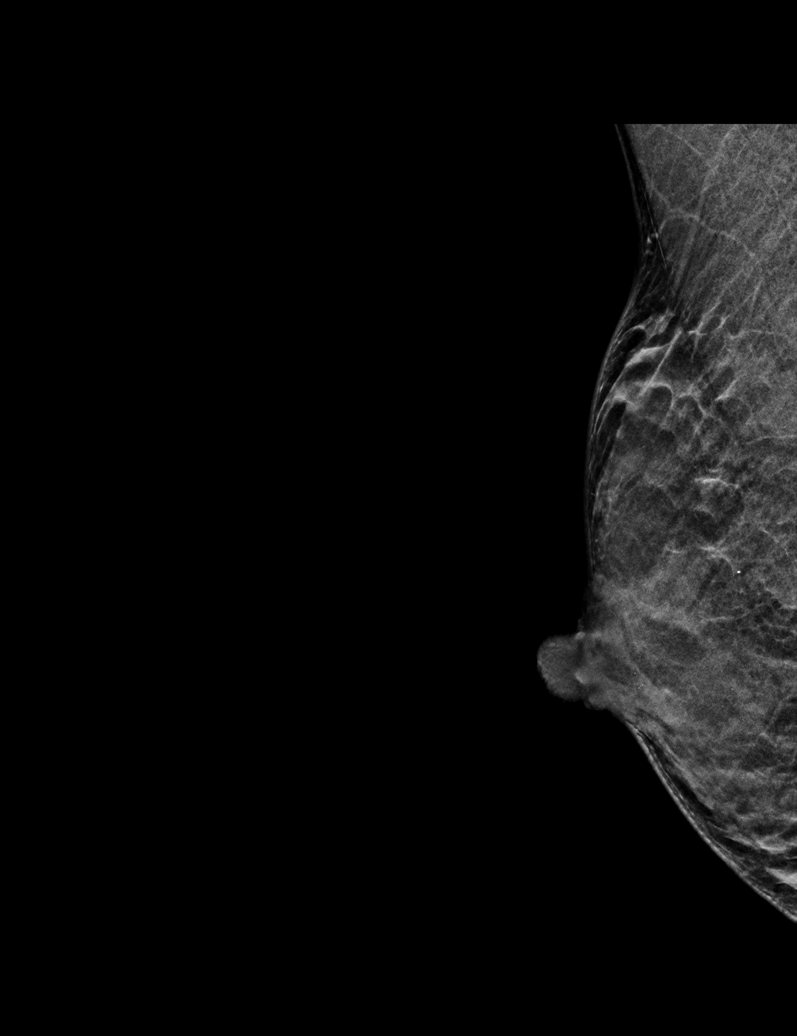

[L CC synth-2D]
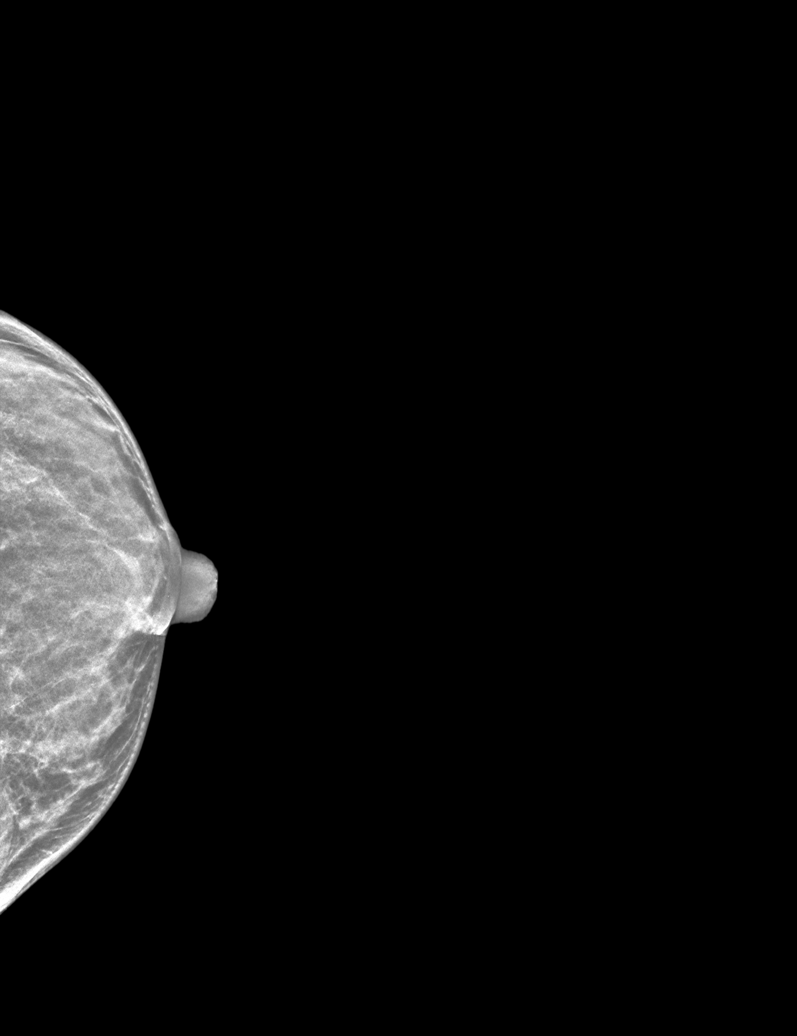

[L MLO synth-2D]
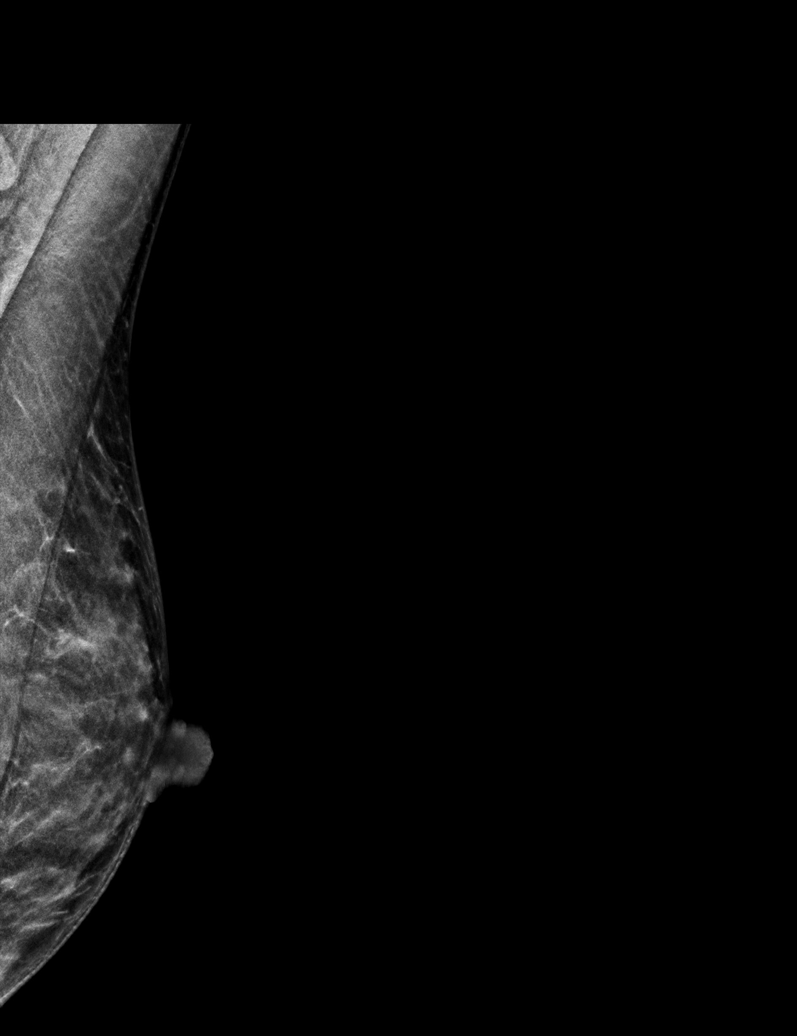

[6 of 26 positions shown; findings below may reference images not displayed]

ACR Breast Density Category d: The breast tissue is extremely dense,
which lowers the sensitivity of mammography.
FINDINGS: The previously described probably benign calcifications anterior
right breast have not changed significantly since 04/15/2017 and
04/02/2017. No interval findings suspicious for malignancy in either
breast.

Mammographic images were processed with CAD.
IMPRESSION: 1. Stable right breast benign calcifications. These do not need
further follow-up.
2. No evidence of malignancy in either breast.

RECOMMENDATION:
Bilateral screening mammogram in year.

I have discussed the findings and recommendations with the patient.
If applicable, a reminder letter will be sent to the patient
regarding the next appointment.

BI-RADS CATEGORY  2: Benign.

## 2021-10-06 ENCOUNTER — Encounter: Payer: Self-pay | Admitting: Internal Medicine

## 2021-10-11 ENCOUNTER — Encounter: Payer: Self-pay | Admitting: Internal Medicine

## 2021-10-11 ENCOUNTER — Ambulatory Visit (AMBULATORY_SURGERY_CENTER): Payer: 59 | Admitting: Internal Medicine

## 2021-10-11 VITALS — BP 111/54 | HR 60 | Temp 97.8°F | Resp 13 | Ht 64.5 in | Wt 116.6 lb

## 2021-10-11 DIAGNOSIS — D122 Benign neoplasm of ascending colon: Secondary | ICD-10-CM

## 2021-10-11 DIAGNOSIS — K635 Polyp of colon: Secondary | ICD-10-CM

## 2021-10-11 DIAGNOSIS — Z1211 Encounter for screening for malignant neoplasm of colon: Secondary | ICD-10-CM | POA: Diagnosis not present

## 2021-10-11 MED ORDER — SODIUM CHLORIDE 0.9 % IV SOLN: 500.0000 mL | Freq: Once | INTRAVENOUS | Status: DC

## 2021-10-11 NOTE — Progress Notes (Signed)
Called to room to assist during endoscopic procedure.  Patient ID and intended procedure confirmed with present staff. Received instructions for my participation in the procedure from the performing physician.  

## 2021-10-11 NOTE — Progress Notes (Signed)
Pt's states no medical or surgical changes since previsit or office visit. 

## 2021-10-11 NOTE — Progress Notes (Signed)
Pt non-responsive, VVS, Report to RN  °

## 2021-10-11 NOTE — Progress Notes (Signed)
GASTROENTEROLOGY PROCEDURE H&P NOTE   Primary Care Physician: Glenis Smoker, MD    Reason for Procedure:   Colon cancer screening  Plan:    Colonoscopy  Patient is appropriate for endoscopic procedure(s) in the ambulatory ([facility]) setting.  The nature of the procedure, as well as the risks, benefits, and alternatives were carefully and thoroughly reviewed with the patient. Ample time for discussion and questions allowed. The patient understood, was satisfied, and agreed to proceed.     HPI: Bailey Figueroa is a 46 y.o. female who presents for colonoscopy for colon cancer screening. Denies blood in stools, changes in bowel habits, weight loss. Denies family history of colon cancer.  Past Medical History:  Diagnosis Date   H/O myringotomy    History of [place] spotted fever     Past Surgical History:  Procedure Laterality Date   MYRINGOTOMY     uterine polyps     WISDOM TOOTH EXTRACTION      Prior to Admission medications   Medication Sig Start Date End Date Taking? Authorizing Provider  Multiple Vitamin (MULTIVITAMIN) tablet Take 1 tablet by mouth daily. 09/13/21  Yes [provider]  PREBIOTIC PRODUCT PO See admin instructions.   Yes [provider]    Current Outpatient Medications  Medication Sig Dispense Refill   Multiple Vitamin (MULTIVITAMIN) tablet Take 1 tablet by mouth daily.     PREBIOTIC PRODUCT PO See admin instructions.     Current Facility-Administered Medications  Medication Dose Route Frequency Provider Last Rate Last Admin   0.9 %  sodium chloride infusion  500 mL Intravenous Once Sharyn Creamer, MD        Allergies as of 10/11/2021 - Review Complete 10/11/2021  Allergen Reaction Noted   Codeine Nausea And Vomiting 07/24/2011    Family History  Problem Relation Age of Onset   Stroke Mother    Heart attack Father    Heart attack Maternal Grandmother    Heart attack Maternal Grandfather    Heart attack  Paternal Grandmother    Stroke Paternal Grandmother    Arthritis Paternal Grandmother    Vision loss Paternal Grandmother        glaucoma   Heart attack Paternal Grandfather    Colon cancer Neg Hx    Esophageal cancer Neg Hx    Rectal cancer Neg Hx    Stomach cancer Neg Hx     Social History   Socioeconomic History   Marital status: Married    Spouse name: Not on file   Number of children: Not on file   Years of education: Not on file   Highest education level: Not on file  Occupational History   Not on file  Tobacco Use   Smoking status: Never   Smokeless tobacco: Never  Vaping Use   Vaping Use: Never used  Substance and Sexual Activity   Alcohol use: Yes    Comment: occasional   Drug use: No   Sexual activity: Yes  Other Topics Concern   Not on file  Social History Narrative   Not on file   Social Determinants of Health   Financial Resource Strain: Not on file  Food Insecurity: Not on file  Transportation Needs: Not on file  Physical Activity: Not on file  Stress: Not on file  Social Connections: Not on file  Intimate Partner Violence: Not on file    Physical Exam: Vital signs in last 24 hours: BP 129/76   Pulse 77  Temp 97.8 F (36.6 C) (Temporal)   Ht 5' 4.5" (1.638 m)   Wt 116 lb 9.6 oz (52.9 kg)   LMP 08/27/2021   SpO2 97%   BMI 19.71 kg/m  GEN: NAD EYE: Sclerae anicteric ENT: MMM CV: Non-tachycardic Pulm: No increased work of breathing GI: Soft, NT/ND NEURO:  Alert & Oriented   Christia Reading, MD [facility]  10/11/2021 8:12 AM

## 2021-10-11 NOTE — Patient Instructions (Signed)
   Handouts on polyps & hemorrhoids given to you today  Await pathology results on polyps removed     YOU HAD AN ENDOSCOPIC PROCEDURE TODAY AT [facility]:   Refer to the procedure report that was given to you for any specific questions about what was found during the examination.  If the procedure report does not answer your questions, please call your gastroenterologist to clarify.  If you requested that your care partner not be given the details of your procedure findings, then the procedure report has been included in a sealed envelope for you to review at your convenience later.  YOU SHOULD EXPECT: Some feelings of bloating in the abdomen. Passage of more gas than usual.  Walking can help get rid of the air that was put into your GI tract during the procedure and reduce the bloating. If you had a lower endoscopy (such as a colonoscopy or flexible sigmoidoscopy) you may notice spotting of blood in your stool or on the toilet paper. If you underwent a bowel prep for your procedure, you may not have a normal bowel movement for a few days.  Please Note:  You might notice some irritation and congestion in your nose or some drainage.  This is from the oxygen used during your procedure.  There is no need for concern and it should clear up in a day or so.  SYMPTOMS TO REPORT IMMEDIATELY:  Following lower endoscopy (colonoscopy or flexible sigmoidoscopy):  Excessive amounts of blood in the stool  Significant tenderness or worsening of abdominal pains  Swelling of the abdomen that is new, acute  Fever of 100F or higher   For urgent or emergent issues, a gastroenterologist can be reached at any hour by calling [phone]. Do not use MyChart messaging for urgent concerns.    DIET:  We do recommend a small meal at first, but then you may proceed to your regular diet.  Drink plenty of fluids but you should avoid alcoholic beverages for 24 hours.  ACTIVITY:  You should plan to  take it easy for the rest of today and you should NOT DRIVE or use heavy machinery until tomorrow (because of the sedation medicines used during the test).    FOLLOW UP: Our staff will call the number listed on your records the next business day following your procedure.  We will call around 7:15- 8:00 am to check on you and address any questions or concerns that you may have regarding the information given to you following your procedure. If we do not reach you, we will leave a message.  If you develop any symptoms (ie: fever, flu-like symptoms, shortness of breath, cough etc.) before then, please call [phone].  If you test positive for Covid 19 in the 2 weeks post procedure, please call and report this information to [place].    If any biopsies were taken you will be contacted by phone or by letter within the next 1-3 weeks.  Please call us at [phone] if you have not heard about the biopsies in 3 weeks.    SIGNATURES/CONFIDENTIALITY: You and/or your care partner have signed paperwork which will be entered into your electronic medical record.  These signatures attest to the fact that that the information above on your After Visit Summary has been reviewed and is understood.  Full responsibility of the confidentiality of this discharge information lies with you and/or your care-partner.

## 2021-10-11 NOTE — Op Note (Signed)
[facility] Patient Name: Bailey Figueroa Procedure Date: 10/11/2021 8:01 AM MRN: [id number] Endoscopist: Sonny Masters "Bailey Figueroa ,  Age: 46 Referring MD:  Date of Birth: Oct 01, 1975 Gender: Female Account #: [id number] Procedure:                Colonoscopy Indications:              Screening for colorectal malignant neoplasm, This                            is the patient's first colonoscopy Medicines:                Monitored Anesthesia Care Procedure:                Pre-Anesthesia Assessment:                           - Prior to the procedure, a History and Physical                            was performed, and patient medications and                            allergies were reviewed. The patient's tolerance of                            previous anesthesia was also reviewed. The risks                            and benefits of the procedure and the sedation                            options and risks were discussed with the patient.                            All questions were answered, and informed consent                            was obtained. Prior Anticoagulants: The patient has                            taken no previous anticoagulant or antiplatelet                            agents. ASA Grade Assessment: I - A normal, healthy                            patient. After reviewing the risks and benefits,                            the patient was deemed in satisfactory condition to                            undergo the procedure.  After obtaining informed consent, the colonoscope                            was passed under direct vision. Throughout the                            procedure, the patient's blood pressure, pulse, and                            oxygen saturations were monitored continuously. The                            Olympus PCF-H190DL (#[id number]) Colonoscope was                            introduced through the anus and  advanced to the the                            terminal ileum. The colonoscopy was performed                            without difficulty. The patient tolerated the                            procedure well. The quality of the bowel                            preparation was excellent. The terminal ileum,                            ileocecal valve, appendiceal orifice, and rectum                            were photographed. Scope In: 8:16:13 AM Scope Out: 8:34:55 AM Scope Withdrawal Time: 0 hours 12 minutes 41 seconds  Total Procedure Duration: 0 hours 18 minutes 42 seconds  Findings:                 The terminal ileum appeared normal.                           A 4 mm polyp was found in the ascending colon. The                            polyp was sessile. The polyp was removed with a                            cold snare. Resection and retrieval were complete.                           A 3 mm polyp was found in the sigmoid colon. The                            polyp was sessile. The polyp was removed with  a                            cold snare. Resection and retrieval were complete.                           Non-bleeding internal hemorrhoids were found during                            retroflexion. Complications:            No immediate complications. Estimated Blood Loss:     Estimated blood loss was minimal. Impression:               - The examined portion of the ileum was normal.                           - One 4 mm polyp in the ascending colon, removed                            with a cold snare. Resected and retrieved.                           - One 3 mm polyp in the sigmoid colon, removed with                            a cold snare. Resected and retrieved.                           - Non-bleeding internal hemorrhoids. Recommendation:           - Discharge patient to home (with escort).                           - Await pathology results.                           - The  findings and recommendations were discussed                            with the patient. Sonny Masters "Bailey Figueroa,  10/11/2021 8:40:03 AM

## 2021-10-12 ENCOUNTER — Telehealth: Payer: Self-pay | Admitting: *Deleted

## 2021-10-12 NOTE — Telephone Encounter (Signed)
Follow-up call.  Left message to call with any questions or concerns.

## 2021-10-13 ENCOUNTER — Encounter: Payer: Self-pay | Admitting: Internal Medicine

## 2021-12-14 ENCOUNTER — Other Ambulatory Visit: Payer: 59

## 2021-12-14 ENCOUNTER — Ambulatory Visit: Payer: 59 | Admitting: Hematology & Oncology
